# Patient Record
Sex: Male | Born: 2000 | Race: White | Hispanic: No | Marital: Single | Smoking: Never smoker
Health system: Southern US, Community
[De-identification: ages and names within clinical notes are randomized; demographics above are authoritative.]

## PROBLEM LIST (undated history)

## (undated) HISTORY — PX: APPENDECTOMY: SHX54

---

## 2019-07-12 ENCOUNTER — Emergency Department (HOSPITAL_COMMUNITY)
Admission: EM | Admit: 2019-07-12 | Discharge: 2019-07-13 | Disposition: A | Discharge status: Home / Self Care | Payer: Medicaid - Out of State | Attending: Emergency Medicine | Admitting: Emergency Medicine

## 2019-07-12 ENCOUNTER — Emergency Department (HOSPITAL_COMMUNITY): Payer: Medicaid - Out of State

## 2019-07-12 ENCOUNTER — Other Ambulatory Visit: Payer: Self-pay

## 2019-07-12 ENCOUNTER — Encounter (HOSPITAL_COMMUNITY): Payer: Self-pay

## 2019-07-12 DIAGNOSIS — Z79899 Other long term (current) drug therapy: Secondary | ICD-10-CM | POA: Insufficient documentation

## 2019-07-12 DIAGNOSIS — R1012 Left upper quadrant pain: Secondary | ICD-10-CM | POA: Diagnosis present

## 2019-07-12 DIAGNOSIS — R109 Unspecified abdominal pain: Secondary | ICD-10-CM

## 2019-07-12 LAB — CBC WITH DIFFERENTIAL/PLATELET
Abs Immature Granulocytes: 0.01 10*3/uL (ref 0.00–0.07)
Basophils Absolute: 0 10*3/uL (ref 0.0–0.1)
Basophils Relative: 0 %
Eosinophils Absolute: 0 10*3/uL (ref 0.0–0.5)
Eosinophils Relative: 0 %
HCT: 44.9 % (ref 39.0–52.0)
Hemoglobin: 15.2 g/dL (ref 13.0–17.0)
Immature Granulocytes: 0 %
Lymphocytes Relative: 19 %
Lymphs Abs: 1.4 10*3/uL (ref 0.7–4.0)
MCH: 29.2 pg (ref 26.0–34.0)
MCHC: 33.9 g/dL (ref 30.0–36.0)
MCV: 86.3 fL (ref 80.0–100.0)
Monocytes Absolute: 0.5 10*3/uL (ref 0.1–1.0)
Monocytes Relative: 7 %
Neutro Abs: 5.3 10*3/uL (ref 1.7–7.7)
Neutrophils Relative %: 74 %
Platelets: 291 10*3/uL (ref 150–400)
RBC: 5.2 MIL/uL (ref 4.22–5.81)
RDW: 12.8 % (ref 11.5–15.5)
WBC: 7.2 10*3/uL (ref 4.0–10.5)
nRBC: 0 % (ref 0.0–0.2)

## 2019-07-12 LAB — COMPREHENSIVE METABOLIC PANEL
ALT: 37 U/L (ref 0–44)
AST: 25 U/L (ref 15–41)
Albumin: 4.7 g/dL (ref 3.5–5.0)
Alkaline Phosphatase: 67 U/L (ref 38–126)
Anion gap: 8 (ref 5–15)
BUN: 9 mg/dL (ref 6–20)
CO2: 24 mmol/L (ref 22–32)
Calcium: 9.6 mg/dL (ref 8.9–10.3)
Chloride: 105 mmol/L (ref 98–111)
Creatinine, Ser: 0.83 mg/dL (ref 0.61–1.24)
GFR calc Af Amer: >60 mL/min (ref >60)
GFR calc non Af Amer: >60 mL/min (ref >60)
Glucose, Bld: 99 mg/dL (ref 70–99)
Potassium: 4 mmol/L (ref 3.5–5.1)
Sodium: 137 mmol/L (ref 135–145)
Total Bilirubin: 1.1 mg/dL (ref 0.3–1.2)
Total Protein: 8.5 g/dL — ABNORMAL HIGH (ref 6.5–8.1)

## 2019-07-12 LAB — URINALYSIS, ROUTINE W REFLEX MICROSCOPIC
Bilirubin Urine: NEGATIVE
Glucose, UA: NEGATIVE mg/dL
Hgb urine dipstick: NEGATIVE
Ketones, ur: 5 mg/dL — AB
Leukocytes,Ua: NEGATIVE
Nitrite: NEGATIVE
Protein, ur: NEGATIVE mg/dL
Specific Gravity, Urine: 1.008 (ref 1.005–1.030)
pH: 6 (ref 5.0–8.0)

## 2019-07-12 LAB — LIPASE, BLOOD: Lipase: 17 U/L (ref 11–51)

## 2019-07-12 MED ORDER — IOHEXOL 9 MG/ML PO SOLN
ORAL | Status: AC
  Filled 2019-07-12: qty 1000

## 2019-07-12 MED ORDER — HYDROMORPHONE HCL 1 MG/ML IJ SOLN
INTRAMUSCULAR | Status: AC
  Filled 2019-07-12: qty 1

## 2019-07-12 MED ORDER — MORPHINE SULFATE (PF) 4 MG/ML IV SOLN
4.0000 mg | Freq: Once | INTRAVENOUS | Status: AC
  Administered 2019-07-12: 4 mg via INTRAVENOUS
  Filled 2019-07-12: qty 1

## 2019-07-12 MED ORDER — HYDROMORPHONE HCL 1 MG/ML IJ SOLN
0.5000 mg | Freq: Once | INTRAMUSCULAR | Status: AC
  Administered 2019-07-12: 0.5 mg via INTRAVENOUS

## 2019-07-12 MED ORDER — ONDANSETRON HCL 4 MG/2ML IJ SOLN
4.0000 mg | Freq: Once | INTRAMUSCULAR | Status: AC
  Administered 2019-07-12: 4 mg via INTRAVENOUS
  Filled 2019-07-12: qty 2

## 2019-07-12 NOTE — ED Triage Notes (Addendum)
Pt reports epigastric pain that started 2 days ago. Pt reports pain after eating and intermittently through the day, but has become constant. Pt saw PCP yesterday and prescribed omeprazole and ibuprofen without relief. Pt had appendectomy 2 weeks ago.

## 2019-07-12 NOTE — ED Provider Notes (Signed)
[facility] EMERGENCY DEPARTMENT Provider Note   CSN: [id number] Arrival date & time: 07/12/19  2120     History Chief Complaint  Patient presents with  . Abdominal Pain    x 2 days ago    Damon Hawkins is a 18 y.o. male.  HPI     Damon Hawkins is a 18 y.o. male who is 2 weeks status post appendectomy  presents to the Emergency Department complaining of left upper abdominal pain that began 2 days ago.  He reports pain began after walking up stairs.  Since that time pain has been constant and nonradiating.  He describes the pain as sharp in character.  He was seen at his PCPs office yesterday and prescribed omeprazole and ibuprofen which is not improving his symptoms.  He went to another ER facility earlier this evening, but came here due to wait time.  He denies fever, vomiting or diarrhea, dysuria and shortness of breath.      History reviewed. No pertinent past medical history.  There are no problems to display for this patient.   Past Surgical History:  Procedure Laterality Date  . APPENDECTOMY       No family history on file.  Social History   Tobacco Use  . Smoking status: Never Smoker  . Smokeless tobacco: Never Used  Substance Use Topics  . Alcohol use: Never  . Drug use: Never    Home Medications Prior to Admission medications   Not on File    Allergies    Patient has no known allergies.  Review of Systems   Review of Systems  Constitutional: Negative for appetite change, chills and fever.  Respiratory: Negative for shortness of breath.   Cardiovascular: Negative for chest pain.  Gastrointestinal: Positive for abdominal pain. Negative for blood in stool, diarrhea, nausea and vomiting.  Genitourinary: Negative for decreased urine volume, difficulty urinating, dysuria and flank pain.  Musculoskeletal: Negative for back pain.  Skin: Negative for color change and rash.  Neurological: Negative for weakness, numbness and headaches.  Hematological:  Negative for adenopathy.    Physical Exam Updated Vital Signs BP 138/77   Pulse 81   Temp 98.4 F (36.9 C) (Oral)   Resp (!) 23   Ht 6\' 4"  (1.93 m)   Wt 95.3 kg   SpO2 99%   BMI 25.56 kg/m   Physical Exam Vitals and nursing note reviewed.  Constitutional:      Appearance: He is well-developed. He is not ill-appearing or toxic-appearing.     Comments: Pt is shivering and uncomfortable appearing.    Cardiovascular:     Rate and Rhythm: Normal rate and regular rhythm.  Pulmonary:     Effort: Pulmonary effort is normal.     Breath sounds: Normal breath sounds. No wheezing or rales.  Chest:     Chest wall: No tenderness.  Abdominal:     General: Abdomen is flat.     Palpations: Abdomen is soft. There is no mass.     Tenderness: There is abdominal tenderness in the left upper quadrant. There is no right CVA tenderness, left CVA tenderness, guarding or rebound. Negative signs include McBurney's sign.     Hernia: No hernia is present.     Comments: Well healed surgical incisions to the abdomen.  No erythema.  ttp of the left upper quadrant.  No peritoneal signs.    Musculoskeletal:        General: Normal range of motion.  Skin:    General:  Skin is warm.     Findings: No rash.  Neurological:     General: No focal deficit present.     Mental Status: He is alert.     Sensory: No sensory deficit.     Motor: No weakness.     ED Results / Procedures / Treatments   Labs (all labs ordered are listed, but only abnormal results are displayed) Labs Reviewed  COMPREHENSIVE METABOLIC PANEL - Abnormal; Notable for the following components:      Result Value   Total Protein 8.5 (*)    All other components within normal limits  URINALYSIS, ROUTINE W REFLEX MICROSCOPIC - Abnormal; Notable for the following components:   Color, Urine STRAW (*)    Ketones, ur 5 (*)    All other components within normal limits  CBC WITH DIFFERENTIAL/PLATELET  LIPASE, BLOOD    EKG EKG  Interpretation  Date/Time:  Saturday July 12 2019 22:02:03 EST Ventricular Rate:  82 PR Interval:    QRS Duration: 110 QT Interval:  368 QTC Calculation: 430 R Axis:   71 Text Interpretation: Sinus rhythm RSR' in V1 or V2, probably normal variant Probable left ventricular hypertrophy No old tracing to compare Confirmed by Marily Memos [phone]) on 07/12/2019 11:40:32 PM   Radiology DG Chest Portable 1 View  Result Date: 07/13/2019 CLINICAL DATA:  Chest pain. Shortness of breath. Recent appendectomy. EXAM: PORTABLE CHEST 1 VIEW COMPARISON:  None. FINDINGS: The cardiomediastinal contours are normal. Scattered subsegmental atelectasis. Pulmonary vasculature is normal. No consolidation, pleural effusion, or pneumothorax. No acute osseous abnormalities are seen. IMPRESSION: Scattered subsegmental atelectasis. Electronically Signed   By: Narda Rutherford M.D.   On: 07/13/2019 00:16    Procedures Procedures (including critical care time)  Medications Ordered in ED Medications  morphine 4 MG/ML injection 4 mg (has no administration in time range)  ondansetron (ZOFRAN) injection 4 mg (has no administration in time range)    ED Course  I have reviewed the triage vital signs and the nursing notes.  Pertinent labs & imaging results that were available during my care of the patient were reviewed by me and considered in my medical decision making (see chart for details).    MDM Rules/Calculators/A&P                      Pt complains of LUQ pain but points to his left lower chest.  He is 2 weeks s/p appendectomy.  He is non-toxic appearing. Pt does appear uncomfortable, but non-toxic.  Source of his pain is unclear at this time. Will obtain labs and CT scan  0100  Pt also seen by Dr. Clayborne Dana who assumes care at end of shift.  CT abd and PE study pending  Final Clinical Impression(s) / ED Diagnoses Final diagnoses:  None    Rx / DC Orders ED Discharge Orders    None         Pauline Aus, PA-C 07/13/19 0056    Marily Memos, MD 07/13/19 [phone]

## 2019-07-12 NOTE — ED Notes (Signed)
Tammy, PA at bedside.  

## 2019-07-13 ENCOUNTER — Other Ambulatory Visit: Payer: Self-pay

## 2019-07-13 ENCOUNTER — Emergency Department (HOSPITAL_COMMUNITY)
Admission: EM | Admit: 2019-07-13 | Discharge: 2019-07-13 | Disposition: A | Discharge status: Home / Self Care | Payer: Medicaid - Out of State | Source: Home / Self Care | Attending: Emergency Medicine | Admitting: Emergency Medicine

## 2019-07-13 ENCOUNTER — Emergency Department (HOSPITAL_COMMUNITY): Payer: Medicaid - Out of State

## 2019-07-13 ENCOUNTER — Encounter (HOSPITAL_COMMUNITY): Payer: Self-pay | Admitting: Emergency Medicine

## 2019-07-13 DIAGNOSIS — Z79899 Other long term (current) drug therapy: Secondary | ICD-10-CM | POA: Insufficient documentation

## 2019-07-13 DIAGNOSIS — R1012 Left upper quadrant pain: Secondary | ICD-10-CM

## 2019-07-13 LAB — CBC WITH DIFFERENTIAL/PLATELET
Abs Immature Granulocytes: 0.02 10*3/uL (ref 0.00–0.07)
Basophils Absolute: 0 10*3/uL (ref 0.0–0.1)
Basophils Relative: 1 %
Eosinophils Absolute: 0 10*3/uL (ref 0.0–0.5)
Eosinophils Relative: 0 %
HCT: 40.5 % (ref 39.0–52.0)
Hemoglobin: 14.1 g/dL (ref 13.0–17.0)
Immature Granulocytes: 0 %
Lymphocytes Relative: 25 %
Lymphs Abs: 1.9 10*3/uL (ref 0.7–4.0)
MCH: 29.4 pg (ref 26.0–34.0)
MCHC: 34.8 g/dL (ref 30.0–36.0)
MCV: 84.6 fL (ref 80.0–100.0)
Monocytes Absolute: 0.5 10*3/uL (ref 0.1–1.0)
Monocytes Relative: 7 %
Neutro Abs: 5.2 10*3/uL (ref 1.7–7.7)
Neutrophils Relative %: 67 %
Platelets: 270 10*3/uL (ref 150–400)
RBC: 4.79 MIL/uL (ref 4.22–5.81)
RDW: 12.5 % (ref 11.5–15.5)
WBC: 7.7 10*3/uL (ref 4.0–10.5)
nRBC: 0 % (ref 0.0–0.2)

## 2019-07-13 LAB — COMPREHENSIVE METABOLIC PANEL
ALT: 33 U/L (ref 0–44)
AST: 24 U/L (ref 15–41)
Albumin: 4.3 g/dL (ref 3.5–5.0)
Alkaline Phosphatase: 56 U/L (ref 38–126)
Anion gap: 12 (ref 5–15)
BUN: 14 mg/dL (ref 6–20)
CO2: 19 mmol/L — ABNORMAL LOW (ref 22–32)
Calcium: 9.2 mg/dL (ref 8.9–10.3)
Chloride: 104 mmol/L (ref 98–111)
Creatinine, Ser: 1.03 mg/dL (ref 0.61–1.24)
GFR calc Af Amer: >60 mL/min (ref >60)
GFR calc non Af Amer: >60 mL/min (ref >60)
Glucose, Bld: 85 mg/dL (ref 70–99)
Potassium: 3.3 mmol/L — ABNORMAL LOW (ref 3.5–5.1)
Sodium: 135 mmol/L (ref 135–145)
Total Bilirubin: 1.3 mg/dL — ABNORMAL HIGH (ref 0.3–1.2)
Total Protein: 7.2 g/dL (ref 6.5–8.1)

## 2019-07-13 LAB — TROPONIN I (HIGH SENSITIVITY)
Troponin I (High Sensitivity): <2 ng/L (ref <18)
Troponin I (High Sensitivity): <2 ng/L (ref <18)

## 2019-07-13 MED ORDER — KETOROLAC TROMETHAMINE 30 MG/ML IJ SOLN
30.0000 mg | Freq: Once | INTRAMUSCULAR | Status: AC
  Administered 2019-07-13: 21:00:00 30 mg via INTRAVENOUS
  Filled 2019-07-13: qty 1

## 2019-07-13 MED ORDER — ALUM & MAG HYDROXIDE-SIMETH 200-200-20 MG/5ML PO SUSP
30.0000 mL | Freq: Once | ORAL | Status: AC
  Administered 2019-07-13: 30 mL via ORAL
  Filled 2019-07-13: qty 30

## 2019-07-13 MED ORDER — LORAZEPAM 1 MG PO TABS: 1.0000 mg | ORAL_TABLET | Freq: Three times a day (TID) | ORAL | 0 refills | Status: AC | PRN

## 2019-07-13 MED ORDER — LIDOCAINE VISCOUS HCL 2 % MT SOLN
15.0000 mL | Freq: Once | OROMUCOSAL | Status: AC
  Administered 2019-07-13: 15 mL via ORAL
  Filled 2019-07-13: qty 15

## 2019-07-13 MED ORDER — LORAZEPAM 2 MG/ML IJ SOLN
1.0000 mg | Freq: Once | INTRAMUSCULAR | Status: AC
  Administered 2019-07-13: 1 mg via INTRAVENOUS
  Filled 2019-07-13: qty 1

## 2019-07-13 MED ORDER — FAMOTIDINE 20 MG PO TABS: 20.0000 mg | ORAL_TABLET | Freq: Two times a day (BID) | ORAL | 0 refills | Status: AC

## 2019-07-13 MED ORDER — ACETAMINOPHEN 500 MG PO TABS
1000.0000 mg | ORAL_TABLET | Freq: Once | ORAL | Status: AC
  Administered 2019-07-13: 1000 mg via ORAL
  Filled 2019-07-13: qty 2

## 2019-07-13 MED ORDER — SIMETHICONE 80 MG PO CHEW
160.0000 mg | CHEWABLE_TABLET | Freq: Once | ORAL | Status: AC
  Administered 2019-07-13: 160 mg via ORAL
  Filled 2019-07-13: qty 2

## 2019-07-13 MED ORDER — IOHEXOL 350 MG/ML SOLN
100.0000 mL | Freq: Once | INTRAVENOUS | Status: AC | PRN
  Administered 2019-07-13: 100 mL via INTRAVENOUS

## 2019-07-13 NOTE — ED Notes (Signed)
Pt c/o headache- new orders received. 

## 2019-07-13 NOTE — ED Notes (Signed)
Pt back from CT

## 2019-07-13 NOTE — ED Notes (Addendum)
Pt anxious, breathing fast, encouraged pt to slow breathing down, and assisted with guided imagery for relaxation. Reassurance given.

## 2019-07-13 NOTE — ED Notes (Signed)
Pt encouraged and coached with slow deep breathing and relaxation techniques. Pt says "medicine took edge off, but still hurts pretty bad". Tammy, PA made aware.

## 2019-07-13 NOTE — ED Provider Notes (Signed)
12:27 AM Assumed care from Dr. Lacinda Axon and [place], please see their note for full history, physical and decision making until this point. In brief this is a 18 y.o. year old male who presented to the ED tonight with Abdominal Pain (x 2 days ago)     Here with abdominal pain after having been seen by pcp/ed and one other (unsure) physician. Burning left upper quadrant.   On my exam patietn is hyperventilating clutching abdomen. His abdomen is soft and he is not guarding. No peritonitis. Vs wnl.  Labs ok. Pending ct scans for disposition.   Ct's ok. Patient with siginficant improvement with ativan. Possibly esophageal spasm? Vs PUD? Will dc with treatment for same with pcp and gi follow up.    Discharge instructions, including strict return precautions for new or worsening symptoms, given. Patient and/or family verbalized understanding and agreement with the plan as described.   Labs, studies and imaging reviewed by myself and considered in medical decision making if ordered. Imaging interpreted by radiology.  Labs Reviewed  COMPREHENSIVE METABOLIC PANEL - Abnormal; Notable for the following components:      Result Value   Total Protein 8.5 (*)    All other components within normal limits  URINALYSIS, ROUTINE W REFLEX MICROSCOPIC - Abnormal; Notable for the following components:   Color, Urine STRAW (*)    Ketones, ur 5 (*)    All other components within normal limits  CBC WITH DIFFERENTIAL/PLATELET  LIPASE, BLOOD    DG Chest Portable 1 View  Final Result    CT ABDOMEN PELVIS W CONTRAST    (Results Pending)  CT Angio Chest PE W and/or Wo Contrast    (Results Pending)    No follow-ups on file.    Manda Holstad, Corene Cornea, MD 07/13/19 [phone]

## 2019-07-13 NOTE — ED Notes (Signed)
Patient transported to CT 

## 2019-07-13 NOTE — ED Notes (Signed)
Call from family member who asks if "you can just keep him all night"  Caller is informed that we cannot keep patients "all night"   She asks, "why not"  Is educated that the evaluation of the physician will determine if pt needs to be admitted and that we could not keep patients here at family request

## 2019-07-13 NOTE — ED Notes (Signed)
Pt texted mom who is in parking lot a/w for pt discharge

## 2019-07-13 NOTE — ED Triage Notes (Signed)
[facility] - pt c/o LUQ and LLQ pain that started x 3 days ago. Pt was seen last night for the same. EMS reports pt was hyperventilating upon arrival.

## 2019-07-13 NOTE — Discharge Instructions (Addendum)
Continue taking the Ativan as directed.  Also, try taking OTC Maalox as directed as well as your Pepcid.  Call the GI provider listed to arrange a follow-up appt or follow-up with your primary doctor.

## 2019-07-13 NOTE — ED Provider Notes (Signed)
[facility] EMERGENCY DEPARTMENT Provider Note   CSN: [id number] Arrival date & time: 07/13/19  1839     History Chief Complaint  Patient presents with  . Abdominal Pain    Damon Hawkins is a 18 y.o. male.  HPI      Damon Hawkins is a 18 y.o. male who returns to the emergency department this evening with continued left upper abdominal and chest wall pain.  He was seen in this emergency department last evening for same.  He was evaluated with laboratory studies and CT angio of his chest and CT of his abdomen and pelvis.  Patient describes a sharp stabbing type pain that began 3 days ago and has been waxing and waning in onset.  He was noted last evening to be hyperventilating on arrival.  Work-up last evening was negative for acute findings.  He was given IV Ativan with significant improvement last evening.  He was discharged home with prescription for Ativan and Pepcid.  He states that he was pain-free until this afternoon.  He took his Pepcid and ate a small meal then took his Ativan.  Pain returned shortly after eating.  Symptoms have not been associated with nausea or vomiting,  diarrhea, melena, or hematochezia.  He has had a bowel movement earlier today and flatus as well.  He states the pain is exacerbated by food intake and movement.  He denies shortness of breath, fever, or chills.    History reviewed. No pertinent past medical history.  There are no problems to display for this patient.   Past Surgical History:  Procedure Laterality Date  . APPENDECTOMY         No family history on file.  Social History   Tobacco Use  . Smoking status: Never Smoker  . Smokeless tobacco: Never Used  Substance Use Topics  . Alcohol use: Never  . Drug use: Never    Home Medications Prior to Admission medications   Medication Sig Start Date End Date Taking? Authorizing Provider  diphenhydrAMINE (BENADRYL) 25 MG tablet Take 25 mg by mouth daily as needed for itching.   Yes  [provider]  hydrOXYzine (ATARAX/VISTARIL) 25 MG tablet Take 25 mg by mouth every 6 (six) hours as needed for itching.  07/08/19  Yes [provider]  ibuprofen (ADVIL) 600 MG tablet Take 600 mg by mouth every 6 (six) hours as needed for mild pain or moderate pain.   Yes [provider]  famotidine (PEPCID) 20 MG tablet Take 1 tablet (20 mg total) by mouth 2 (two) times daily. 07/13/19   Mesner, Barbara Cower, MD  LORazepam (ATIVAN) 1 MG tablet Take 1 tablet (1 mg total) by mouth 3 (three) times daily as needed for anxiety. 07/13/19   Mesner, Barbara Cower, MD    Allergies    Patient has no known allergies.  Review of Systems   Review of Systems  Constitutional: Negative for appetite change, chills and fever.  Respiratory: Negative for shortness of breath.   Cardiovascular: Positive for chest pain.  Gastrointestinal: Positive for abdominal pain. Negative for blood in stool, diarrhea, nausea and vomiting.  Genitourinary: Negative for decreased urine volume, difficulty urinating, dysuria and flank pain.  Musculoskeletal: Negative for back pain.  Skin: Negative for color change and rash.  Neurological: Negative for dizziness, weakness and numbness.  Hematological: Negative for adenopathy.    Physical Exam Updated Vital Signs BP 132/81   Pulse 76   Temp 98.2 F (36.8 C) (Oral)   Resp 18  Ht  (1.93 m)   Wt 95.2 kg   SpO2 100%   BMI 25.56 kg/m   Physical Exam Vitals and nursing note reviewed.  Constitutional:      General: He is not in acute distress.    Appearance: He is well-developed.     Comments: Patient is anxious appearing  HENT:     Head: Normocephalic and atraumatic.     Mouth/Throat:     Mouth: Mucous membranes are moist.  Cardiovascular:     Rate and Rhythm: Normal rate and regular rhythm.     Pulses: Normal pulses.     Heart sounds: Normal heart sounds. No murmur.  Pulmonary:     Effort: Pulmonary effort is normal. No respiratory distress.      Breath sounds: Normal breath sounds.  Chest:     Chest wall: Tenderness (Patient is tenderness to palpation of the lower left anterior chest wall.  No crepitus) present.  Abdominal:     General: Bowel sounds are normal. There is no distension.     Palpations: Abdomen is soft. There is no mass.     Tenderness: There is abdominal tenderness in the left upper quadrant. There is no guarding or rebound.     Comments: Tenderness to palpation of the left upper quadrant.  No guarding or rebound tenderness.  Abdomen is soft without peritoneal signs.  Musculoskeletal:        General: Normal range of motion.  Skin:    General: Skin is warm and dry.  Neurological:     Mental Status: He is alert.     Sensory: Sensation is intact. No sensory deficit.     Motor: Motor function is intact. No weakness or abnormal muscle tone.     Coordination: Coordination normal.     Comments: CN II-XII grossly intact     ED Results / Procedures / Treatments   Labs (all labs ordered are listed, but only abnormal results are displayed) Labs Reviewed  COMPREHENSIVE METABOLIC PANEL - Abnormal; Notable for the following components:      Result Value   Potassium 3.3 (*)    CO2 19 (*)    Total Bilirubin 1.3 (*)    All other components within normal limits  CBC WITH DIFFERENTIAL/PLATELET  TROPONIN I (HIGH SENSITIVITY)  TROPONIN I (HIGH SENSITIVITY)    EKG None  Radiology CT Angio Chest PE W and/or Wo Contrast  Result Date: 07/13/2019 CLINICAL DATA:  Shortness of breath. Pain post appendectomy. EXAM: CT ANGIOGRAPHY CHEST CT ABDOMEN AND PELVIS WITH CONTRAST TECHNIQUE: Multidetector CT imaging of the chest was performed using the standard protocol during bolus administration of intravenous contrast. Multiplanar CT image reconstructions and MIPs were obtained to evaluate the vascular anatomy. Multidetector CT imaging of the abdomen and pelvis was performed using the standard protocol during bolus administration  of intravenous contrast. CONTRAST:  OMNIPAQUE IOHEXOL 350 MG/ML SOLN COMPARISON:  None. FINDINGS: CTA CHEST FINDINGS Cardiovascular: Evaluation for pulmonary emboli is severely limited by contrast bolus timing and respiratory motion artifact. Given these limitations, no large centrally located pulmonary embolus was detected.The heart size is normal. There is no significant pericardial effusion. Mediastinum/Nodes: --No mediastinal or hilar lymphadenopathy. --No axillary lymphadenopathy. --No supraclavicular lymphadenopathy. --they are is a 1.8 cm left thyroid nodule. --The esophagus is unremarkable Lungs/Pleura: No pulmonary nodules or masses. No pleural effusion or pneumothorax. No focal airspace consolidation. No focal pleural abnormality. Musculoskeletal: No chest wall abnormality. No acute or significant osseous findings. Bilateral gynecomastia is  noted. CT ABDOMEN and PELVIS FINDINGS Hepatobiliary: The liver is normal. Normal gallbladder.There is no biliary ductal dilation. Pancreas: Normal contours without ductal dilatation. No peripancreatic fluid collection. Spleen: No splenic laceration or hematoma. Adrenals/Urinary Tract: --Adrenal glands: No adrenal hemorrhage. --Right kidney/ureter: No hydronephrosis or perinephric hematoma. 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 --Left kidney/ureter: No hydronephrosis or perinephric hematoma. --Urinary bladder: Unremarkable. Stomach/Bowel: --Stomach/Duodenum: No hiatal hernia or other gastric abnormality. Normal duodenal course and caliber. --Small bowel: No dilatation or inflammation. --Colon: No focal abnormality. --Appendix: Surgically absent. Vascular/Lymphatic: Normal course and caliber of the major abdominal vessels. --No retroperitoneal lymphadenopathy. --No mesenteric lymphadenopathy. --No pelvic or inguinal lymphadenopathy. Reproductive: Unremarkable Other: There is a trace amount of free fluid in the  patient's pelvis. The abdominal wall is normal. Musculoskeletal. No acute displaced fractures. IMPRESSION: 1. Evaluation for pulmonary emboli is severely limited by contrast bolus timing and respiratory motion artifact. Given these limitations, no large centrally located pulmonary embolus was detected. 2. Trace amount of free fluid in the patient's pelvis, nonspecific. 3. Bilateral gynecomastia. 4. A 1.8 cm left thyroid nodule. An outpatient thyroid ultrasound is recommended for further evaluation of this finding.(Ref: J Am Coll Radiol. 2015 Feb;12(2): 143-50). Electronically Signed   By: Katherine Mantlehristopher  Green M.D.   On: 07/13/2019 02:04   CT ABDOMEN PELVIS W CONTRAST  Result Date: 07/13/2019 CLINICAL DATA:  Shortness of breath. Pain post appendectomy. EXAM: CT ANGIOGRAPHY CHEST CT ABDOMEN AND PELVIS WITH CONTRAST TECHNIQUE: Multidetector CT imaging of the chest was performed using the standard protocol during bolus administration of intravenous contrast. Multiplanar CT image reconstructions and MIPs were obtained to evaluate the vascular anatomy. Multidetector CT imaging of the abdomen and pelvis was performed using the standard protocol during bolus administration of intravenous contrast. CONTRAST:  100mL OMNIPAQUE IOHEXOL 350 MG/ML SOLN COMPARISON:  None. FINDINGS: CTA CHEST FINDINGS Cardiovascular: Evaluation for pulmonary emboli is severely limited by contrast bolus timing and respiratory motion artifact. Given these limitations, no large centrally located pulmonary embolus was detected.The heart size is normal. There is no significant pericardial effusion. Mediastinum/Nodes: --No mediastinal or hilar lymphadenopathy. --No axillary lymphadenopathy. --No supraclavicular lymphadenopathy. --they are is a 1.8 cm left thyroid nodule. --The esophagus is unremarkable Lungs/Pleura: No pulmonary nodules or masses. No pleural effusion or pneumothorax. No focal airspace consolidation. No focal pleural abnormality.  Musculoskeletal: No chest wall abnormality. No acute or significant osseous findings. Bilateral gynecomastia is noted. CT ABDOMEN and PELVIS FINDINGS Hepatobiliary: The liver is normal. Normal gallbladder.There is no biliary ductal dilation. Pancreas: Normal contours without ductal dilatation. No peripancreatic fluid collection. Spleen: No splenic laceration or hematoma. Adrenals/Urinary Tract: --Adrenal glands: No adrenal hemorrhage. --Right kidney/ureter: No hydronephrosis or perinephric hematoma. 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 --Left kidney/ureter: No hydronephrosis or perinephric hematoma. --Urinary bladder: Unremarkable. Stomach/Bowel: --Stomach/Duodenum: No hiatal hernia or other gastric abnormality. Normal duodenal course and caliber. --Small bowel: No dilatation or inflammation. --Colon: No focal abnormality. --Appendix: Surgically absent. Vascular/Lymphatic: Normal course and caliber of the major abdominal vessels. --No retroperitoneal lymphadenopathy. --No mesenteric lymphadenopathy. --No pelvic or inguinal lymphadenopathy. Reproductive: Unremarkable Other: There is a trace amount of free fluid in the patient's pelvis. The abdominal wall is normal. Musculoskeletal. No acute displaced fractures. IMPRESSION: 1. Evaluation for pulmonary emboli is severely limited by contrast bolus timing and respiratory motion artifact. Given these limitations, no large centrally located pulmonary embolus was detected. 2. Trace amount of free fluid  in the patient's pelvis, nonspecific. 3. Bilateral gynecomastia. 4. A 1.8 cm left thyroid nodule. An outpatient thyroid ultrasound is recommended for further evaluation of this finding.(Ref: J Am Coll Radiol. 2015 Feb;12(2): 143-50). Electronically Signed   By: Constance Holster M.D.   On: 07/13/2019 02:04   DG Chest Portable 1 View  Result Date: 07/13/2019 CLINICAL DATA:  Chest pain. Shortness of breath. Recent  appendectomy. EXAM: PORTABLE CHEST 1 VIEW COMPARISON:  None. FINDINGS: The cardiomediastinal contours are normal. Scattered subsegmental atelectasis. Pulmonary vasculature is normal. No consolidation, pleural effusion, or pneumothorax. No acute osseous abnormalities are seen. IMPRESSION: Scattered subsegmental atelectasis. Electronically Signed   By: Keith Rake M.D.   On: 07/13/2019 00:16    Procedures Procedures (including critical care time)  Medications Ordered in ED Medications  alum & mag hydroxide-simeth (MAALOX/MYLANTA) 200-200-20 MG/5ML suspension 30 mL (30 mLs Oral Given 07/13/19 2003)    And  lidocaine (XYLOCAINE) 2 % viscous mouth solution 15 mL (15 mLs Oral Given 07/13/19 2003)  acetaminophen (TYLENOL) tablet 1,000 mg (1,000 mg Oral Given 07/13/19 2107)  ketorolac (TORADOL) 30 MG/ML injection 30 mg (30 mg Intravenous Given 07/13/19 2108)  simethicone (MYLICON) chewable tablet 160 mg (160 mg Oral Given 07/13/19 2227)    ED Course  I have reviewed the triage vital signs and the nursing notes.  Pertinent labs & imaging results that were available during my care of the patient were reviewed by me and considered in my medical decision making (see chart for details).    MDM Rules/Calculators/A&P                      Patient seen by me yesterday for same.  Returns tonight with a recurrence of his pain after eating.  Patient had extensive work-up last evening including CT angio of his chest and CT abdomen and pelvis without acute findings.  Laboratory studies also without significant finding.  Etiology of patient's pain is unclear, doubtful of emergent process.  Patient was given prescriptions for Ativan and Pepcid last evening.  Of note, patient did receive significant improvement of his symptoms last evening with Ativan.  Tonight, his symptoms improved after Maalox and viscous lidocaine.  Patient's symptoms may be related to esophageal spasms.  He will be given referral to GI.   Low clinical suspicion for cardiac process.  He appears appropriate for discharge home, patient was also seen by Dr. Lacinda Axon and care plan discussed.   Final Clinical Impression(s) / ED Diagnoses Final diagnoses:  None    Rx / DC Orders ED Discharge Orders    None       Kem Parkinson, PA-C 07/14/19 0026    Nat Christen, MD 07/16/19 [phone]

## 2019-07-13 NOTE — ED Notes (Addendum)
Pt c/o pain to left upper abdomen and says pain moved to central chest after walking to bathroom and back to room. Pt is moaning out in pain and has tears in eyes. Tammy, PA made aware. New orders received.

## 2019-07-14 ENCOUNTER — Emergency Department (HOSPITAL_COMMUNITY)
Admission: EM | Admit: 2019-07-14 | Discharge: 2019-07-14 | Disposition: A | Discharge status: Home / Self Care | Payer: Medicaid - Out of State | Attending: Emergency Medicine | Admitting: Emergency Medicine

## 2019-07-14 ENCOUNTER — Other Ambulatory Visit: Payer: Self-pay

## 2019-07-14 ENCOUNTER — Encounter (HOSPITAL_COMMUNITY): Payer: Self-pay | Admitting: Emergency Medicine

## 2019-07-14 DIAGNOSIS — R0789 Other chest pain: Secondary | ICD-10-CM | POA: Diagnosis present

## 2019-07-14 DIAGNOSIS — R091 Pleurisy: Secondary | ICD-10-CM | POA: Diagnosis not present

## 2019-07-14 DIAGNOSIS — Z9889 Other specified postprocedural states: Secondary | ICD-10-CM | POA: Insufficient documentation

## 2019-07-14 MED ORDER — METHOCARBAMOL 500 MG PO TABS: 500.0000 mg | ORAL_TABLET | Freq: Two times a day (BID) | ORAL | 0 refills | Status: AC | PRN

## 2019-07-14 MED ORDER — PANTOPRAZOLE SODIUM 40 MG PO TBEC: 40.0000 mg | DELAYED_RELEASE_TABLET | Freq: Every day | ORAL | 0 refills | Status: AC

## 2019-07-14 NOTE — ED Provider Notes (Signed)
[facility] EMERGENCY DEPARTMENT Provider Note   CSN: [id number] Arrival date & time: 07/14/19  0141     History Chief Complaint  Patient presents with  . Pain    Damon Hawkins is a 18 y.o. male.  HPI   This patient is a 18 year old male, he has a known history of a recent appendectomy performed in the last 2 to 3 weeks up in [place].  Since that time he had done well but over the last couple of days has developed a pain in the left lower chest over the ribs underneath the left nipple.  He reports that this pain is intermittent but seems to be there more than it is gone.  Seems to be worse with deep breathing, with laying down, with eating.  It is not associated with fevers chills nausea or vomiting.  He has had decreased appetite since the surgery.  The patient has been seen twice in the last 48 hours, he has had a CT scan of the chest abdomen and pelvis demonstrating no signs of pulmonary embolism and no signs of pneumonia or intra-abdominal postoperative complications or other pathology.  His lab work has been pristine without any signs of leukocytosis, pancreatitis or cholecystitis.  I have discussed his care with his mother who agrees  History reviewed. No pertinent past medical history.  There are no problems to display for this patient.   Past Surgical History:  Procedure Laterality Date  . APPENDECTOMY         No family history on file.  Social History   Tobacco Use  . Smoking status: Never Smoker  . Smokeless tobacco: Never Used  Substance Use Topics  . Alcohol use: Never  . Drug use: Never    Home Medications Prior to Admission medications   Medication Sig Start Date End Date Taking? Authorizing Provider  diphenhydrAMINE (BENADRYL) 25 MG tablet Take 25 mg by mouth daily as needed for itching.    [provider]  famotidine (PEPCID) 20 MG tablet Take 1 tablet (20 mg total) by mouth 2 (two) times daily. 07/13/19   Mesner, Corene Cornea, MD    hydrOXYzine (ATARAX/VISTARIL) 25 MG tablet Take 25 mg by mouth every 6 (six) hours as needed for itching.  07/08/19   [provider]  ibuprofen (ADVIL) 600 MG tablet Take 600 mg by mouth every 6 (six) hours as needed for mild pain or moderate pain.    [provider]  LORazepam (ATIVAN) 1 MG tablet Take 1 tablet (1 mg total) by mouth 3 (three) times daily as needed for anxiety. 07/13/19   Mesner, Corene Cornea, MD  methocarbamol (ROBAXIN) 500 MG tablet Take 1 tablet (500 mg total) by mouth 2 (two) times daily as needed for muscle spasms. 07/14/19   Noemi Chapel, MD  pantoprazole (PROTONIX) 40 MG tablet Take 1 tablet (40 mg total) by mouth daily. 07/14/19   Noemi Chapel, MD    Allergies    Patient has no known allergies.  Review of Systems   Review of Systems  All other systems reviewed and are negative.   Physical Exam Updated Vital Signs BP 128/81 (BP Location: Left Arm)   Temp 98.2 F (36.8 C) (Oral)   Ht 1.93 m (6\' 4" )   Wt 95.2 kg   SpO2 97%   BMI 25.56 kg/m   Physical Exam Vitals and nursing note reviewed.  Constitutional:      General: He is not in acute distress.    Appearance: He is well-developed.  HENT:     Head: Normocephalic and atraumatic.     Mouth/Throat:     Pharynx: No oropharyngeal exudate.  Eyes:     General: No scleral icterus.       Right eye: No discharge.        Left eye: No discharge.     Conjunctiva/sclera: Conjunctivae normal.     Pupils: Pupils are equal, round, and reactive to light.  Neck:     Thyroid: No thyromegaly.     Vascular: No JVD.  Cardiovascular:     Rate and Rhythm: Normal rate and regular rhythm.     Heart sounds: Normal heart sounds. No murmur. No friction rub. No gallop.   Pulmonary:     Effort: Pulmonary effort is normal. No respiratory distress.     Breath sounds: Normal breath sounds. No wheezing or rales.     Comments: There is tenderness over the left lower chest wall, there is no signs of rashes  here Chest:     Chest wall: Tenderness present.  Abdominal:     General: Bowel sounds are normal. There is no distension.     Palpations: Abdomen is soft. There is no mass.     Tenderness: There is abdominal tenderness.     Comments: Mild tenderness to the left upper quadrant, there is no rash, there is no hepatosplenomegaly  Musculoskeletal:        General: No tenderness. Normal range of motion.     Cervical back: Normal range of motion and neck supple.  Lymphadenopathy:     Cervical: No cervical adenopathy.  Skin:    General: Skin is warm and dry.     Findings: No erythema or rash.  Neurological:     Mental Status: He is alert.     Coordination: Coordination normal.  Psychiatric:        Behavior: Behavior normal.     ED Results / Procedures / Treatments   Labs (all labs ordered are listed, but only abnormal results are displayed) Labs Reviewed - No data to display  EKG None  Radiology CT Angio Chest PE W and/or Wo Contrast  Result Date: 07/13/2019 CLINICAL DATA:  Shortness of breath. Pain post appendectomy. EXAM: CT ANGIOGRAPHY CHEST CT ABDOMEN AND PELVIS WITH CONTRAST TECHNIQUE: Multidetector CT imaging of the chest was performed using the standard protocol during bolus administration of intravenous contrast. Multiplanar CT image reconstructions and MIPs were obtained to evaluate the vascular anatomy. Multidetector CT imaging of the abdomen and pelvis was performed using the standard protocol during bolus administration of intravenous contrast. CONTRAST:  OMNIPAQUE IOHEXOL 350 MG/ML SOLN COMPARISON:  None. FINDINGS: CTA CHEST FINDINGS Cardiovascular: Evaluation for pulmonary emboli is severely limited by contrast bolus timing and respiratory motion artifact. Given these limitations, no large centrally located pulmonary embolus was detected.The heart size is normal. There is no significant pericardial effusion. Mediastinum/Nodes: --No mediastinal or hilar lymphadenopathy.  --No axillary lymphadenopathy. --No supraclavicular lymphadenopathy. --they are is a 1.8 cm left thyroid nodule. --The esophagus is unremarkable Lungs/Pleura: No pulmonary nodules or masses. No pleural effusion or pneumothorax. No focal airspace consolidation. No focal pleural abnormality. Musculoskeletal: No chest wall abnormality. No acute or significant osseous findings. Bilateral gynecomastia is noted. CT ABDOMEN and PELVIS FINDINGS Hepatobiliary: The liver is normal. Normal gallbladder.There is no biliary ductal dilation. Pancreas: Normal contours without ductal dilatation. No peripancreatic fluid collection. Spleen: No splenic laceration or hematoma. Adrenals/Urinary Tract: --Adrenal glands: No adrenal hemorrhage. --Right kidney/ureter: No hydronephrosis or perinephric  hematoma. 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 --Left kidney/ureter: No hydronephrosis or perinephric hematoma. --Urinary bladder: Unremarkable. Stomach/Bowel: --Stomach/Duodenum: No hiatal hernia or other gastric abnormality. Normal duodenal course and caliber. --Small bowel: No dilatation or inflammation. --Colon: No focal abnormality. --Appendix: Surgically absent. Vascular/Lymphatic: Normal course and caliber of the major abdominal vessels. --No retroperitoneal lymphadenopathy. --No mesenteric lymphadenopathy. --No pelvic or inguinal lymphadenopathy. Reproductive: Unremarkable Other: There is a trace amount of free fluid in the patient's pelvis. The abdominal wall is normal. Musculoskeletal. No acute displaced fractures. IMPRESSION: 1. Evaluation for pulmonary emboli is severely limited by contrast bolus timing and respiratory motion artifact. Given these limitations, no large centrally located pulmonary embolus was detected. 2. Trace amount of free fluid in the patient's pelvis, nonspecific. 3. Bilateral gynecomastia. 4. A 1.8 cm left thyroid nodule. An outpatient thyroid ultrasound is  recommended for further evaluation of this finding.(Ref: J Am Coll Radiol. 2015 Feb;12(2): 143-50). Electronically Signed   By: Katherine Mantlehristopher  Green M.D.   On: 07/13/2019 02:04   CT ABDOMEN PELVIS W CONTRAST  Result Date: 07/13/2019 CLINICAL DATA:  Shortness of breath. Pain post appendectomy. EXAM: CT ANGIOGRAPHY CHEST CT ABDOMEN AND PELVIS WITH CONTRAST TECHNIQUE: Multidetector CT imaging of the chest was performed using the standard protocol during bolus administration of intravenous contrast. Multiplanar CT image reconstructions and MIPs were obtained to evaluate the vascular anatomy. Multidetector CT imaging of the abdomen and pelvis was performed using the standard protocol during bolus administration of intravenous contrast. CONTRAST:  100mL OMNIPAQUE IOHEXOL 350 MG/ML SOLN COMPARISON:  None. FINDINGS: CTA CHEST FINDINGS Cardiovascular: Evaluation for pulmonary emboli is severely limited by contrast bolus timing and respiratory motion artifact. Given these limitations, no large centrally located pulmonary embolus was detected.The heart size is normal. There is no significant pericardial effusion. Mediastinum/Nodes: --No mediastinal or hilar lymphadenopathy. --No axillary lymphadenopathy. --No supraclavicular lymphadenopathy. --they are is a 1.8 cm left thyroid nodule. --The esophagus is unremarkable Lungs/Pleura: No pulmonary nodules or masses. No pleural effusion or pneumothorax. No focal airspace consolidation. No focal pleural abnormality. Musculoskeletal: No chest wall abnormality. No acute or significant osseous findings. Bilateral gynecomastia is noted. CT ABDOMEN and PELVIS FINDINGS Hepatobiliary: The liver is normal. Normal gallbladder.There is no biliary ductal dilation. Pancreas: Normal contours without ductal dilatation. No peripancreatic fluid collection. Spleen: No splenic laceration or hematoma. Adrenals/Urinary Tract: --Adrenal glands: No adrenal hemorrhage. --Right kidney/ureter: No  hydronephrosis or perinephric hematoma. 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 --Left kidney/ureter: No hydronephrosis or perinephric hematoma. --Urinary bladder: Unremarkable. Stomach/Bowel: --Stomach/Duodenum: No hiatal hernia or other gastric abnormality. Normal duodenal course and caliber. --Small bowel: No dilatation or inflammation. --Colon: No focal abnormality. --Appendix: Surgically absent. Vascular/Lymphatic: Normal course and caliber of the major abdominal vessels. --No retroperitoneal lymphadenopathy. --No mesenteric lymphadenopathy. --No pelvic or inguinal lymphadenopathy. Reproductive: Unremarkable Other: There is a trace amount of free fluid in the patient's pelvis. The abdominal wall is normal. Musculoskeletal. No acute displaced fractures. IMPRESSION: 1. Evaluation for pulmonary emboli is severely limited by contrast bolus timing and respiratory motion artifact. Given these limitations, no large centrally located pulmonary embolus was detected. 2. Trace amount of free fluid in the patient's pelvis, nonspecific. 3. Bilateral gynecomastia. 4. A 1.8 cm left thyroid nodule. An outpatient thyroid ultrasound is recommended for further evaluation of this finding.(Ref: J Am Coll Radiol. 2015 Feb;12(2): 143-50). Electronically Signed   By: Katherine Mantlehristopher  Green M.D.   On: 07/13/2019  02:04   DG Chest Portable 1 View  Result Date: 07/13/2019 CLINICAL DATA:  Chest pain. Shortness of breath. Recent appendectomy. EXAM: PORTABLE CHEST 1 VIEW COMPARISON:  None. FINDINGS: The cardiomediastinal contours are normal. Scattered subsegmental atelectasis. Pulmonary vasculature is normal. No consolidation, pleural effusion, or pneumothorax. No acute osseous abnormalities are seen. IMPRESSION: Scattered subsegmental atelectasis. Electronically Signed   By: Narda Rutherford M.D.   On: 07/13/2019 00:16    Procedures Procedures (including critical care  time)  Medications Ordered in ED Medications - No data to display  ED Course  I have reviewed the triage vital signs and the nursing notes.  Pertinent labs & imaging results that were available during my care of the patient were reviewed by me and considered in my medical decision making (see chart for details).    MDM Rules/Calculators/A&P                     I have reviewed the patient's medical record at length, I see no findings of pathological concern, the patient is having ongoing intermittent pain which is concerning to him because it is not getting any better, the family was concerned that he may have peptic ulcer disease though I doubt that this would come on and give him symptoms so quickly especially at his young age and without significant risk factors.  He will be given a proton pump inhibitor, he will be given a dose of medication here and I have given him reassurance that his rather pleuritic sounding pain should get better over the next 7 to 10 days.  Both he and his mother are in agreement.  He appears stable for discharge otherwise Final Clinical Impression(s) / ED Diagnoses Final diagnoses:  Pleurisy    Rx / DC Orders ED Discharge Orders    None       Eber Hong, MD 07/14/19 [phone]

## 2019-07-14 NOTE — Discharge Instructions (Addendum)
Your testing has all been very reassuring, this may be related to pleurisy, please read above  Seek a medical exam for severe or worsening symptoms but be aware that she may hurt for another week to 10 days.  If the pain becomes worse or if you start to have vomiting or fevers come back to the hospital immediately

## 2019-07-14 NOTE — ED Triage Notes (Signed)
Pt with c/o pain that extends from under L breast to L. side.

## 2019-07-15 ENCOUNTER — Telehealth: Payer: Self-pay

## 2019-07-15 NOTE — Telephone Encounter (Signed)
Please schedule ov.  

## 2019-07-15 NOTE — Telephone Encounter (Signed)
PATIENT SEEN IN ER, NEW PATIENT REFERRAL

## 2019-07-15 NOTE — Telephone Encounter (Signed)
CALLED PATIENT AND HE HAS [employer], WE DO NOT TAKE THAT, I TOLD HIM HE WILL NEED TO CONTACT HIS PCP OR INSURANCE TO FIND OUT WHERE HE CAN FOLLOW UP

## 2019-07-24 ENCOUNTER — Ambulatory Visit: Payer: Medicaid - Out of State | Admitting: Gastroenterology

## 2019-08-09 ENCOUNTER — Other Ambulatory Visit: Payer: Self-pay

## 2019-08-09 ENCOUNTER — Emergency Department (HOSPITAL_COMMUNITY): Payer: Medicaid - Out of State

## 2019-08-09 ENCOUNTER — Emergency Department (HOSPITAL_COMMUNITY)
Admission: EM | Admit: 2019-08-09 | Discharge: 2019-08-09 | Disposition: A | Discharge status: Home / Self Care | Payer: Medicaid - Out of State | Attending: Emergency Medicine | Admitting: Emergency Medicine

## 2019-08-09 ENCOUNTER — Encounter (HOSPITAL_COMMUNITY): Payer: Self-pay

## 2019-08-09 DIAGNOSIS — Z5321 Procedure and treatment not carried out due to patient leaving prior to being seen by health care provider: Secondary | ICD-10-CM | POA: Diagnosis not present

## 2019-08-09 DIAGNOSIS — R0789 Other chest pain: Secondary | ICD-10-CM | POA: Diagnosis present

## 2019-08-09 NOTE — ED Triage Notes (Signed)
Pt c/o left chest pain, stabbing, onset this evening,

## 2021-08-12 IMAGING — CT CT ABD-PELV W/ CM
2 of 11 series · 12 of 46 positions shown, 17 images · IV contrast (Omnipaque or Isovue)
Comparison: None.

CLINICAL DATA: Shortness of breath. Pain post appendectomy.

EXAM:
CT ANGIOGRAPHY CHEST
CT ABDOMEN AND PELVIS WITH CONTRAST
TECHNIQUE: Multidetector CT imaging of the chest was performed using the
standard protocol during bolus administration of intravenous
contrast. Multiplanar CT image reconstructions and MIPs were
obtained to evaluate the vascular anatomy. Multidetector CT imaging
of the abdomen and pelvis was performed using the standard protocol
during bolus administration of intravenous contrast.
CONTRAST:  100mL OMNIPAQUE IOHEXOL 350 MG/ML SOLN

[Series 7: cor soft · coronal · 0.70mm/px · 1 of 144 slices shown, 2 images]
[im 72/144  soft-tissue]
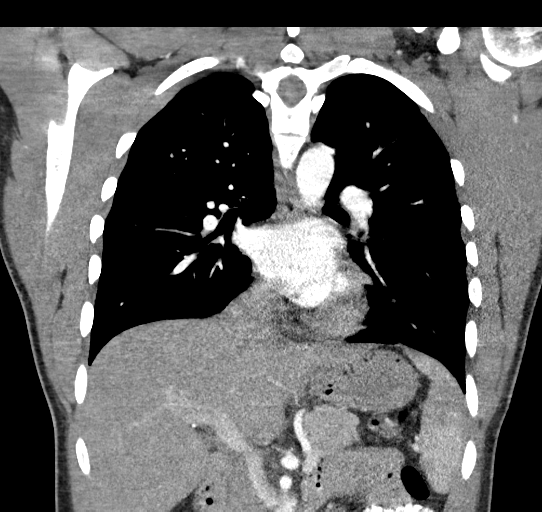
[im 72/144  bone]
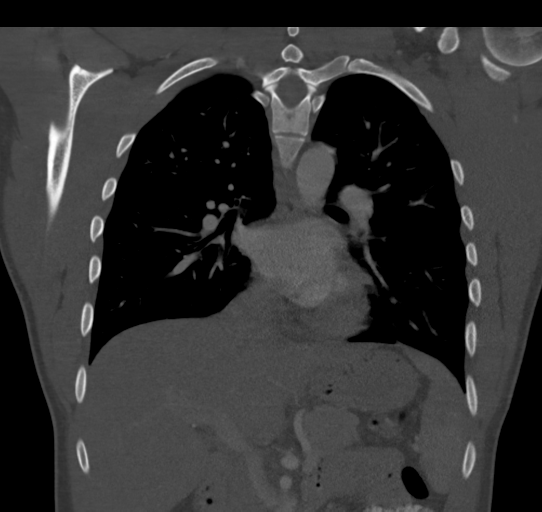

[Series 13: thins · axial · 0.75mm/px · z∈[+1034,+1456]mm · 11 of 1003 slices shown, 15 images]
[im 106/1003  soft-tissue]
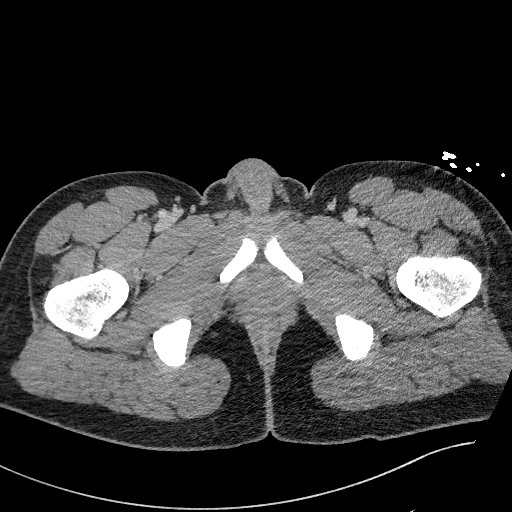
[im 106/1003  bone]
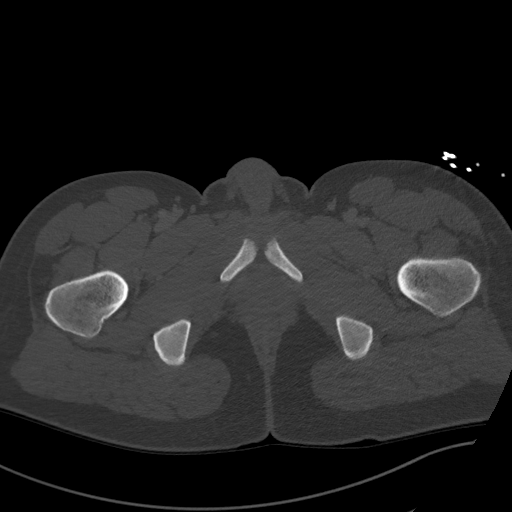
[im 211/1003  soft-tissue]
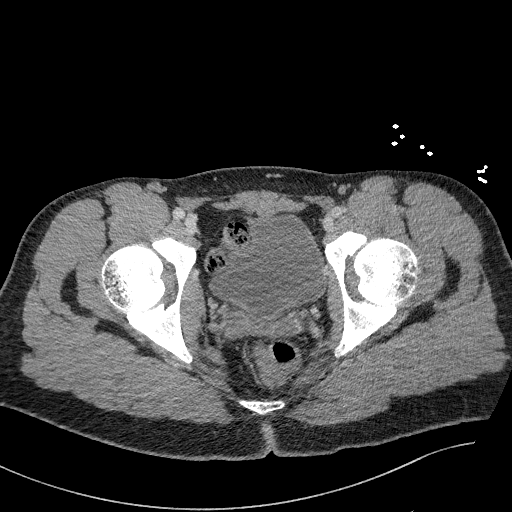
[im 317/1003  soft-tissue]
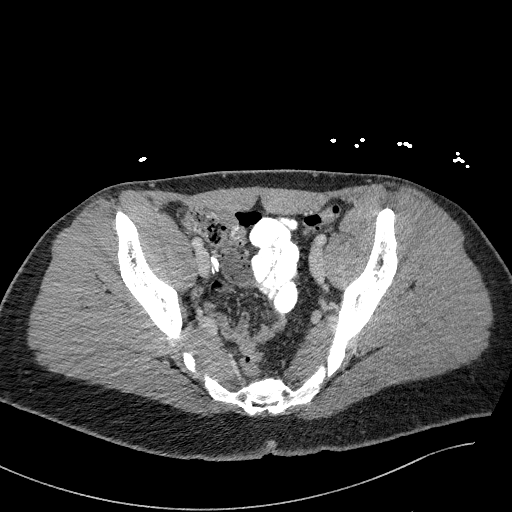
[im 422/1003  soft-tissue]
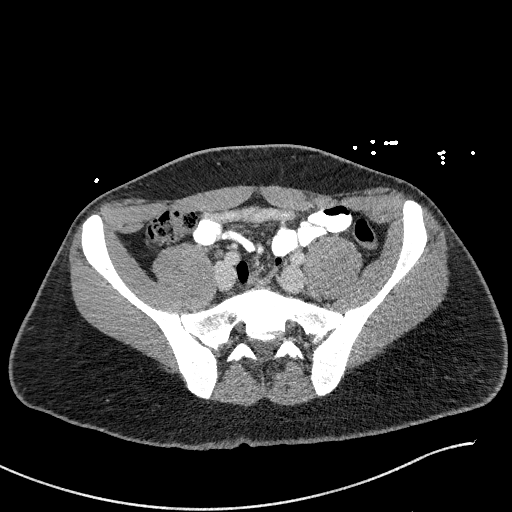
[im 528/1003  soft-tissue]
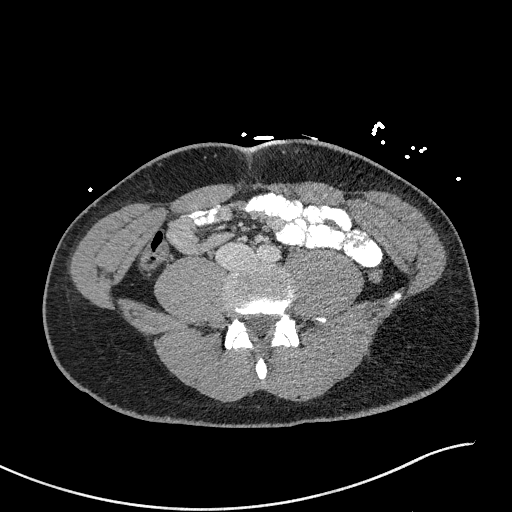
[im 633/1003  soft-tissue]
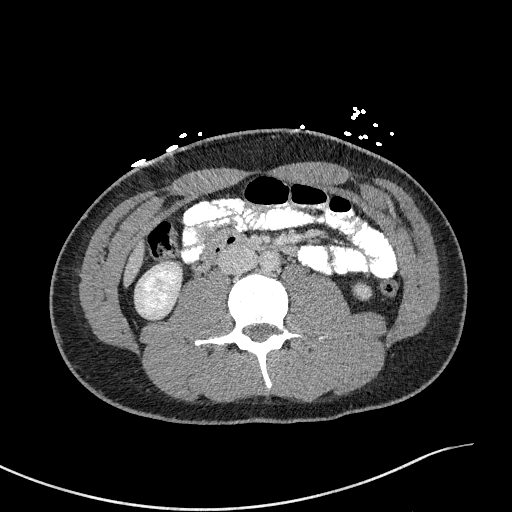
[im 739/1003  soft-tissue]
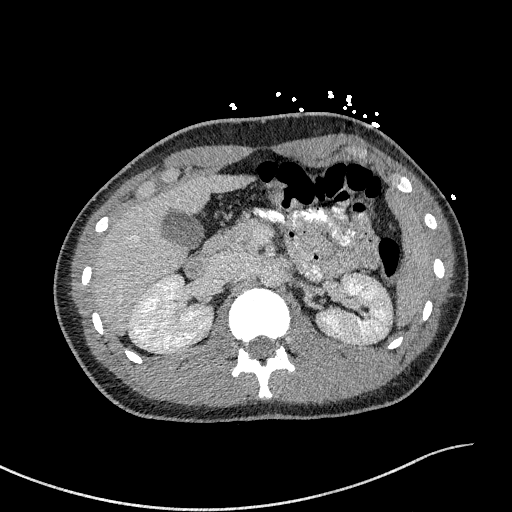
[im 792/1003  lung]
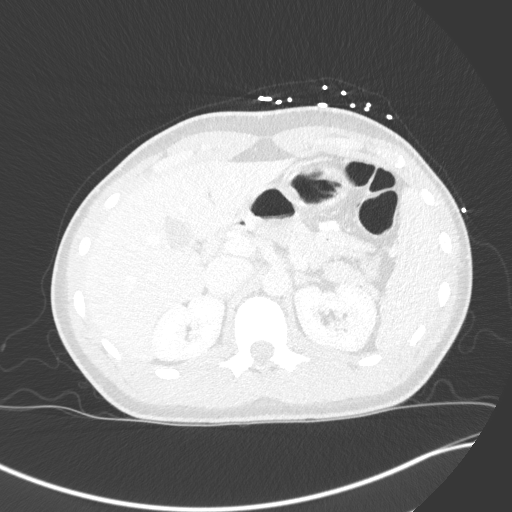
[im 844/1003  soft-tissue]
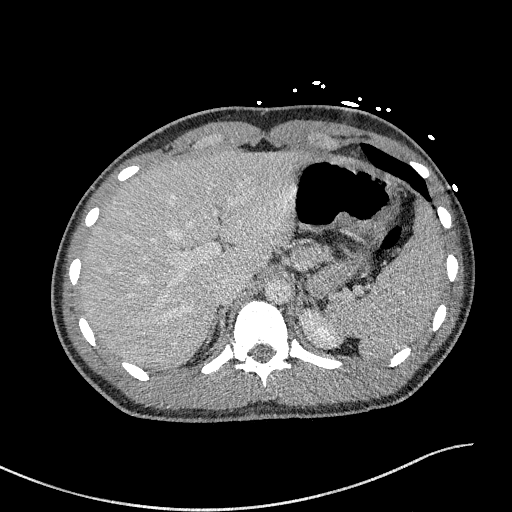
[im 844/1003  lung]
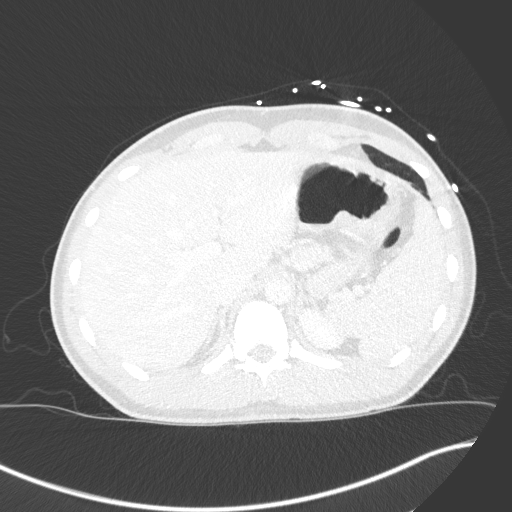
[im 897/1003  lung]
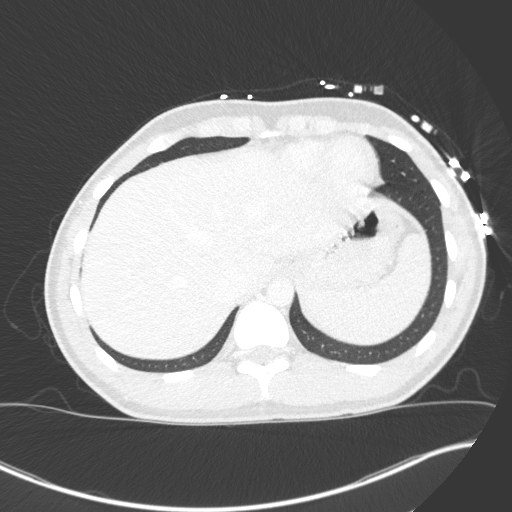
[im 950/1003  soft-tissue]
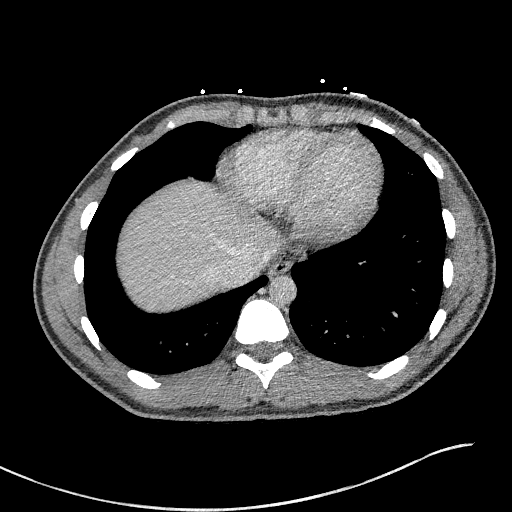
[im 950/1003  lung]
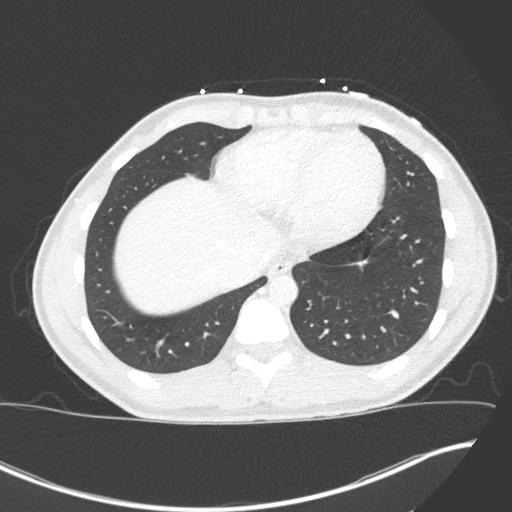
[im 950/1003  bone]
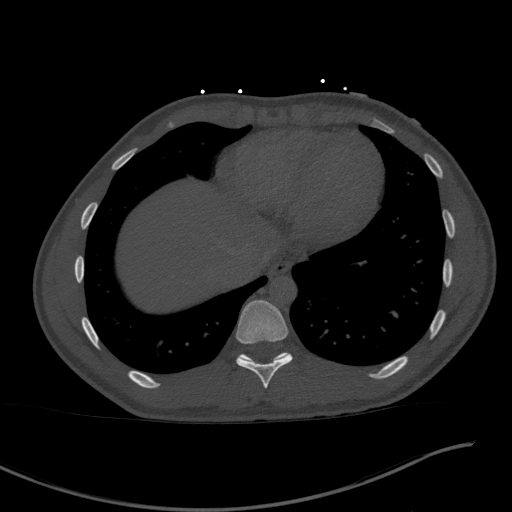

[12 of 46 positions shown; findings below may reference images not displayed]

FINDINGS: CTA CHEST FINDINGS

Cardiovascular: Evaluation for pulmonary emboli is severely limited
by contrast bolus timing and respiratory motion artifact. Given
these limitations, no large centrally located pulmonary embolus was
detected.The heart size is normal. There is no significant
pericardial effusion.

Mediastinum/Nodes:

--No mediastinal or hilar lymphadenopathy.

--No axillary lymphadenopathy.

--No supraclavicular lymphadenopathy.

--they are is a 1.8 cm left thyroid nodule.

--The esophagus is unremarkable

Lungs/Pleura: No pulmonary nodules or masses. No pleural effusion or
pneumothorax. No focal airspace consolidation. No focal pleural
abnormality.

Musculoskeletal: No chest wall abnormality. No acute or significant
osseous findings. Bilateral gynecomastia is noted.

CT ABDOMEN and PELVIS FINDINGS

Hepatobiliary: The liver is normal. Normal gallbladder.There is no
biliary ductal dilation.

Pancreas: Normal contours without ductal dilatation. No
peripancreatic fluid collection.

Spleen: No splenic laceration or hematoma.

Adrenals/Urinary Tract:

--Adrenal glands: No adrenal hemorrhage.

--Right kidney/ureter: No hydronephrosis or perinephric hematoma.
There is an at least partially duplicated right renal collecting
system, a normal variant.

--Left kidney/ureter: No hydronephrosis or perinephric hematoma.

--Urinary bladder: Unremarkable.

Stomach/Bowel:

--Stomach/Duodenum: No hiatal hernia or other gastric abnormality.
Normal duodenal course and caliber.

--Small bowel: No dilatation or inflammation.

--Colon: No focal abnormality.

--Appendix: Surgically absent.

Vascular/Lymphatic: Normal course and caliber of the major abdominal
vessels.

--No retroperitoneal lymphadenopathy.

--No mesenteric lymphadenopathy.

--No pelvic or inguinal lymphadenopathy.

Reproductive: Unremarkable

Other: There is a trace amount of free fluid in the patient's
pelvis. The abdominal wall is normal.

Musculoskeletal. No acute displaced fractures.
IMPRESSION: 1. Evaluation for pulmonary emboli is severely limited by contrast
bolus timing and respiratory motion artifact. Given these
limitations, no large centrally located pulmonary embolus was
detected.
2. Trace amount of free fluid in the patient's pelvis, nonspecific.
3. Bilateral gynecomastia.
4. A 1.8 cm left thyroid nodule. An outpatient thyroid ultrasound is
recommended for further evaluation of this finding.(Ref: [HOSPITAL]. [DATE]): 143-50).
